# Patient Record
Sex: Male | Born: 1980 | Race: Black or African American | Hispanic: No | Marital: Single | Smoking: Current some day smoker
Health system: Southern US, Community
[De-identification: ages and names within clinical notes are randomized; demographics above are authoritative.]

## PROBLEM LIST (undated history)

## (undated) DIAGNOSIS — I10 Essential (primary) hypertension: Secondary | ICD-10-CM

## (undated) HISTORY — PX: APPENDECTOMY: SHX54

## (undated) HISTORY — PX: HERNIA REPAIR: SHX51

---

## 2017-12-10 ENCOUNTER — Ambulatory Visit (HOSPITAL_COMMUNITY)
Admission: EM | Admit: 2017-12-10 | Discharge: 2017-12-10 | Disposition: A | Discharge status: Home / Self Care | Payer: Self-pay | Attending: Family Medicine | Admitting: Family Medicine

## 2017-12-10 ENCOUNTER — Ambulatory Visit (INDEPENDENT_AMBULATORY_CARE_PROVIDER_SITE_OTHER): Payer: Self-pay

## 2017-12-10 ENCOUNTER — Encounter (HOSPITAL_COMMUNITY): Payer: Self-pay | Admitting: Emergency Medicine

## 2017-12-10 DIAGNOSIS — M79641 Pain in right hand: Secondary | ICD-10-CM

## 2017-12-10 DIAGNOSIS — M79642 Pain in left hand: Secondary | ICD-10-CM

## 2017-12-10 DIAGNOSIS — R21 Rash and other nonspecific skin eruption: Secondary | ICD-10-CM

## 2017-12-10 DIAGNOSIS — J301 Allergic rhinitis due to pollen: Secondary | ICD-10-CM

## 2017-12-10 HISTORY — DX: Essential (primary) hypertension: I10

## 2017-12-10 MED ORDER — TRIAMCINOLONE ACETONIDE 0.5 % EX OINT: 1.0000 "application " | TOPICAL_OINTMENT | Freq: Two times a day (BID) | CUTANEOUS | 0 refills | Status: AC

## 2017-12-10 MED ORDER — FLUTICASONE PROPIONATE 50 MCG/ACT NA SUSP: 2.0000 | Freq: Every day | NASAL | 2 refills | Status: AC
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
